# Patient Record
Sex: Female | Born: 1945 | Race: White | Hispanic: No | Marital: Married | Smoking: Never smoker
Health system: Southern US, Community
[De-identification: ages and names within clinical notes are randomized; demographics above are authoritative.]

## PROBLEM LIST (undated history)

## (undated) DIAGNOSIS — E785 Hyperlipidemia, unspecified: Secondary | ICD-10-CM

## (undated) DIAGNOSIS — I10 Essential (primary) hypertension: Secondary | ICD-10-CM

## (undated) DIAGNOSIS — E079 Disorder of thyroid, unspecified: Secondary | ICD-10-CM

## (undated) HISTORY — DX: Essential (primary) hypertension: I10

## (undated) HISTORY — DX: Disorder of thyroid, unspecified: E07.9

## (undated) HISTORY — PX: DILATION AND CURETTAGE OF UTERUS: SHX78

## (undated) HISTORY — DX: Hyperlipidemia, unspecified: E78.5

---
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
## 1981-02-24 DIAGNOSIS — E079 Disorder of thyroid, unspecified: Secondary | ICD-10-CM

## 1981-02-24 HISTORY — DX: Disorder of thyroid, unspecified: E07.9

## 1983-02-25 HISTORY — PX: ABDOMINAL HYSTERECTOMY: SHX81

## 1999-03-28 DIAGNOSIS — I10 Essential (primary) hypertension: Secondary | ICD-10-CM

## 1999-03-28 HISTORY — DX: Essential (primary) hypertension: I10

## 1999-07-26 DIAGNOSIS — E785 Hyperlipidemia, unspecified: Secondary | ICD-10-CM

## 1999-07-26 HISTORY — DX: Hyperlipidemia, unspecified: E78.5

## 1999-08-16 ENCOUNTER — Encounter: Payer: Self-pay | Admitting: Family Medicine

## 1999-08-16 LAB — CONVERTED CEMR LAB: TSH: 0.14 microintl units/mL

## 2001-06-03 ENCOUNTER — Encounter: Payer: Self-pay | Admitting: Family Medicine

## 2001-06-03 LAB — CONVERTED CEMR LAB: TSH: 4 microintl units/mL

## 2003-02-01 ENCOUNTER — Encounter: Payer: Self-pay | Admitting: Family Medicine

## 2003-02-01 LAB — CONVERTED CEMR LAB: TSH: 2.241 microintl units/mL

## 2003-02-02 ENCOUNTER — Encounter: Payer: Self-pay | Admitting: Family Medicine

## 2003-02-02 LAB — CONVERTED CEMR LAB: Blood Glucose, Fasting: 94 mg/dL

## 2004-01-06 ENCOUNTER — Emergency Department: Payer: Self-pay | Admitting: Emergency Medicine

## 2004-01-12 ENCOUNTER — Ambulatory Visit: Payer: Self-pay | Admitting: Family Medicine

## 2004-01-26 ENCOUNTER — Ambulatory Visit: Payer: Self-pay | Admitting: Family Medicine

## 2004-02-14 ENCOUNTER — Encounter: Admission: RE | Admit: 2004-02-14 | Discharge: 2004-02-14 | Payer: Self-pay | Admitting: Sports Medicine

## 2004-04-15 ENCOUNTER — Ambulatory Visit: Payer: Self-pay | Admitting: Family Medicine

## 2004-04-15 LAB — CONVERTED CEMR LAB: Blood Glucose, Fasting: 106 mg/dL

## 2004-04-17 ENCOUNTER — Ambulatory Visit: Payer: Self-pay | Admitting: Family Medicine

## 2004-04-17 ENCOUNTER — Other Ambulatory Visit: Admission: RE | Admit: 2004-04-17 | Discharge: 2004-04-17 | Payer: Self-pay | Admitting: Family Medicine

## 2004-04-17 LAB — CONVERTED CEMR LAB: Pap Smear: NORMAL

## 2004-05-14 ENCOUNTER — Ambulatory Visit: Payer: Self-pay | Admitting: Family Medicine

## 2004-07-19 ENCOUNTER — Ambulatory Visit: Payer: Self-pay | Admitting: Family Medicine

## 2004-07-24 ENCOUNTER — Ambulatory Visit: Payer: Self-pay | Admitting: Family Medicine

## 2005-12-12 ENCOUNTER — Ambulatory Visit: Payer: Self-pay | Admitting: Family Medicine

## 2005-12-15 ENCOUNTER — Ambulatory Visit: Payer: Self-pay | Admitting: Family Medicine

## 2005-12-16 ENCOUNTER — Ambulatory Visit: Payer: Self-pay | Admitting: Family Medicine

## 2006-04-20 ENCOUNTER — Ambulatory Visit: Payer: Self-pay | Admitting: Family Medicine

## 2006-04-20 LAB — CONVERTED CEMR LAB
ALT: 28 units/L (ref 0–40)
AST: 24 units/L (ref 0–37)
Albumin: 4.1 g/dL (ref 3.5–5.2)
Alkaline Phosphatase: 85 units/L (ref 39–117)
BUN: 14 mg/dL (ref 6–23)
Basophils Absolute: 0.1 10*3/uL (ref 0.0–0.1)
Basophils Relative: 0.6 % (ref 0.0–1.0)
Bilirubin, Direct: 0.1 mg/dL (ref 0.0–0.3)
Blood Glucose, Fasting: 109 mg/dL
CO2: 30 meq/L (ref 19–32)
Calcium: 9.5 mg/dL (ref 8.4–10.5)
Chloride: 103 meq/L (ref 96–112)
Cholesterol: 224 mg/dL (ref 0–200)
Creatinine, Ser: 0.7 mg/dL (ref 0.4–1.2)
Direct LDL: 128.4 mg/dL
Eosinophils Absolute: 0.2 10*3/uL (ref 0.0–0.6)
Eosinophils Relative: 1.9 % (ref 0.0–5.0)
Free T4: 0.8 ng/dL (ref 0.6–1.6)
GFR calc Af Amer: 109 mL/min
GFR calc non Af Amer: 90 mL/min
Glucose, Bld: 109 mg/dL — ABNORMAL HIGH (ref 70–99)
HCT: 41.3 % (ref 36.0–46.0)
HDL: 42.8 mg/dL (ref >39.0)
Hemoglobin: 14.1 g/dL (ref 12.0–15.0)
Lymphocytes Relative: 42 % (ref 12.0–46.0)
MCHC: 34.1 g/dL (ref 30.0–36.0)
MCV: 89.3 fL (ref 78.0–100.0)
Monocytes Absolute: 0.6 10*3/uL (ref 0.2–0.7)
Monocytes Relative: 6.9 % (ref 3.0–11.0)
Neutro Abs: 4 10*3/uL (ref 1.4–7.7)
Neutrophils Relative %: 48.6 % (ref 43.0–77.0)
Platelets: 292 10*3/uL (ref 150–400)
Potassium: 3.9 meq/L (ref 3.5–5.1)
RBC: 4.62 M/uL (ref 3.87–5.11)
RDW: 11.6 % (ref 11.5–14.6)
Sodium: 142 meq/L (ref 135–145)
TSH: 0.8 microintl units/mL (ref 0.35–5.50)
Total Bilirubin: 0.8 mg/dL (ref 0.3–1.2)
Total CHOL/HDL Ratio: 5.2
Total Protein: 7.5 g/dL (ref 6.0–8.3)
Triglycerides: 298 mg/dL (ref 0–149)
VLDL: 60 mg/dL — ABNORMAL HIGH (ref 0–40)
WBC, blood: 8.4 10*3/uL
WBC: 8.4 10*3/uL (ref 4.5–10.5)

## 2006-04-22 ENCOUNTER — Ambulatory Visit: Payer: Self-pay | Admitting: Family Medicine

## 2006-05-05 ENCOUNTER — Ambulatory Visit: Payer: Self-pay | Admitting: Family Medicine

## 2006-12-18 ENCOUNTER — Ambulatory Visit: Payer: Self-pay | Admitting: Family Medicine

## 2006-12-18 ENCOUNTER — Encounter: Payer: Self-pay | Admitting: Family Medicine

## 2006-12-21 ENCOUNTER — Encounter (INDEPENDENT_AMBULATORY_CARE_PROVIDER_SITE_OTHER): Payer: Self-pay | Admitting: *Deleted

## 2006-12-22 ENCOUNTER — Encounter: Payer: Self-pay | Admitting: Family Medicine

## 2006-12-22 DIAGNOSIS — E039 Hypothyroidism, unspecified: Secondary | ICD-10-CM | POA: Insufficient documentation

## 2006-12-22 DIAGNOSIS — R7309 Other abnormal glucose: Secondary | ICD-10-CM

## 2006-12-22 DIAGNOSIS — E785 Hyperlipidemia, unspecified: Secondary | ICD-10-CM | POA: Insufficient documentation

## 2006-12-22 DIAGNOSIS — I10 Essential (primary) hypertension: Secondary | ICD-10-CM | POA: Insufficient documentation

## 2007-06-11 ENCOUNTER — Ambulatory Visit: Payer: Self-pay | Admitting: Family Medicine

## 2007-08-03 ENCOUNTER — Ambulatory Visit: Payer: Self-pay | Admitting: Family Medicine

## 2007-08-03 LAB — CONVERTED CEMR LAB
Albumin: 4.2 g/dL (ref 3.5–5.2)
Alkaline Phosphatase: 84 units/L (ref 39–117)
Basophils Absolute: 0 10*3/uL (ref 0.0–0.1)
Bilirubin, Direct: 0.1 mg/dL (ref 0.0–0.3)
Chloride: 101 meq/L (ref 96–112)
Cholesterol: 214 mg/dL (ref 0–200)
Direct LDL: 134.1 mg/dL
Eosinophils Absolute: 0.2 10*3/uL (ref 0.0–0.7)
Free T4: 1 ng/dL (ref 0.6–1.6)
GFR calc Af Amer: 93 mL/min
GFR calc non Af Amer: 77 mL/min
HCT: 42.3 % (ref 36.0–46.0)
HDL: 39.5 mg/dL (ref >39.0)
MCV: 92.7 fL (ref 78.0–100.0)
Microalb Creat Ratio: 2.7 mg/g (ref 0.0–30.0)
Monocytes Absolute: 0.5 10*3/uL (ref 0.1–1.0)
Neutrophils Relative %: 53.6 % (ref 43.0–77.0)
Platelets: 253 10*3/uL (ref 150–400)
Potassium: 4 meq/L (ref 3.5–5.1)
RDW: 11.8 % (ref 11.5–14.6)
Sodium: 140 meq/L (ref 135–145)
TSH: 0.74 microintl units/mL (ref 0.35–5.50)
Total Bilirubin: 0.7 mg/dL (ref 0.3–1.2)
Triglycerides: 258 mg/dL (ref 0–149)
Uric Acid, Serum: 8.6 mg/dL — ABNORMAL HIGH (ref 2.4–7.0)

## 2007-08-05 ENCOUNTER — Encounter (INDEPENDENT_AMBULATORY_CARE_PROVIDER_SITE_OTHER): Payer: Self-pay | Admitting: *Deleted

## 2007-08-06 ENCOUNTER — Ambulatory Visit: Payer: Self-pay | Admitting: Family Medicine

## 2007-08-06 ENCOUNTER — Encounter: Payer: Self-pay | Admitting: Family Medicine

## 2007-08-06 ENCOUNTER — Other Ambulatory Visit: Admission: RE | Admit: 2007-08-06 | Discharge: 2007-08-06 | Payer: Self-pay | Admitting: Family Medicine

## 2007-08-06 LAB — CONVERTED CEMR LAB: Pap Smear: NORMAL

## 2007-08-11 ENCOUNTER — Encounter: Payer: Self-pay | Admitting: Family Medicine

## 2007-08-11 ENCOUNTER — Ambulatory Visit: Payer: Self-pay | Admitting: Family Medicine

## 2007-08-12 ENCOUNTER — Encounter (INDEPENDENT_AMBULATORY_CARE_PROVIDER_SITE_OTHER): Payer: Self-pay | Admitting: *Deleted

## 2007-09-27 ENCOUNTER — Ambulatory Visit: Payer: Self-pay | Admitting: Family Medicine

## 2007-10-04 LAB — CONVERTED CEMR LAB: OCCULT 3: NEGATIVE

## 2007-10-20 ENCOUNTER — Ambulatory Visit: Payer: Self-pay | Admitting: Family Medicine

## 2007-10-25 ENCOUNTER — Ambulatory Visit: Payer: Self-pay | Admitting: Family Medicine

## 2007-11-03 ENCOUNTER — Encounter (INDEPENDENT_AMBULATORY_CARE_PROVIDER_SITE_OTHER): Payer: Self-pay | Admitting: *Deleted

## 2007-12-13 ENCOUNTER — Encounter: Payer: Self-pay | Admitting: Family Medicine

## 2007-12-20 ENCOUNTER — Ambulatory Visit: Payer: Self-pay | Admitting: Family Medicine

## 2007-12-23 ENCOUNTER — Ambulatory Visit: Payer: Self-pay | Admitting: Family Medicine

## 2007-12-23 ENCOUNTER — Encounter: Payer: Self-pay | Admitting: Family Medicine

## 2007-12-24 ENCOUNTER — Telehealth (INDEPENDENT_AMBULATORY_CARE_PROVIDER_SITE_OTHER): Payer: Self-pay | Admitting: Internal Medicine

## 2007-12-27 ENCOUNTER — Encounter (INDEPENDENT_AMBULATORY_CARE_PROVIDER_SITE_OTHER): Payer: Self-pay | Admitting: *Deleted

## 2008-01-24 ENCOUNTER — Ambulatory Visit: Payer: Self-pay | Admitting: Family Medicine

## 2008-02-25 HISTORY — PX: ULNAR NERVE REPAIR: SHX2594

## 2008-03-14 ENCOUNTER — Ambulatory Visit: Payer: Self-pay | Admitting: Family Medicine

## 2008-03-16 ENCOUNTER — Ambulatory Visit: Payer: Self-pay | Admitting: Internal Medicine

## 2008-03-16 DIAGNOSIS — K625 Hemorrhage of anus and rectum: Secondary | ICD-10-CM

## 2008-03-16 DIAGNOSIS — K644 Residual hemorrhoidal skin tags: Secondary | ICD-10-CM | POA: Insufficient documentation

## 2008-08-01 ENCOUNTER — Ambulatory Visit: Payer: Self-pay | Admitting: Family Medicine

## 2008-08-01 LAB — CONVERTED CEMR LAB
Albumin: 4.2 g/dL (ref 3.5–5.2)
Basophils Relative: 0.5 % (ref 0.0–3.0)
Bilirubin, Direct: 0.1 mg/dL (ref 0.0–0.3)
CO2: 30 meq/L (ref 19–32)
Calcium: 9.4 mg/dL (ref 8.4–10.5)
Cholesterol: 210 mg/dL — ABNORMAL HIGH (ref 0–200)
Creatinine, Ser: 0.7 mg/dL (ref 0.4–1.2)
Creatinine,U: 155.1 mg/dL
Direct LDL: 135.9 mg/dL
HDL: 41.7 mg/dL (ref >39.00)
Hemoglobin: 13.9 g/dL (ref 12.0–15.0)
Lymphocytes Relative: 33.4 % (ref 12.0–46.0)
MCHC: 34.8 g/dL (ref 30.0–36.0)
Microalb, Ur: 0.4 mg/dL (ref 0.0–1.9)
Monocytes Relative: 5.2 % (ref 3.0–12.0)
Neutro Abs: 6 10*3/uL (ref 1.4–7.7)
RBC: 4.35 M/uL (ref 3.87–5.11)
Total CHOL/HDL Ratio: 5
Total Protein: 7.4 g/dL (ref 6.0–8.3)
Triglycerides: 253 mg/dL — ABNORMAL HIGH (ref 0.0–149.0)
Uric Acid, Serum: 8.2 mg/dL — ABNORMAL HIGH (ref 2.4–7.0)

## 2008-08-07 ENCOUNTER — Ambulatory Visit: Payer: Self-pay | Admitting: Family Medicine

## 2008-08-07 DIAGNOSIS — M25519 Pain in unspecified shoulder: Secondary | ICD-10-CM | POA: Insufficient documentation

## 2008-08-09 ENCOUNTER — Encounter: Payer: Self-pay | Admitting: Family Medicine

## 2008-08-25 ENCOUNTER — Ambulatory Visit: Payer: Self-pay | Admitting: Family Medicine

## 2008-08-25 ENCOUNTER — Encounter (INDEPENDENT_AMBULATORY_CARE_PROVIDER_SITE_OTHER): Payer: Self-pay | Admitting: *Deleted

## 2008-10-31 ENCOUNTER — Ambulatory Visit: Payer: Self-pay | Admitting: Family Medicine

## 2008-11-06 ENCOUNTER — Encounter: Payer: Self-pay | Admitting: Family Medicine

## 2008-12-20 ENCOUNTER — Ambulatory Visit: Payer: Self-pay | Admitting: Family Medicine

## 2008-12-20 ENCOUNTER — Encounter: Payer: Self-pay | Admitting: Family Medicine

## 2008-12-21 ENCOUNTER — Encounter (INDEPENDENT_AMBULATORY_CARE_PROVIDER_SITE_OTHER): Payer: Self-pay | Admitting: *Deleted

## 2009-01-31 ENCOUNTER — Ambulatory Visit: Payer: Self-pay | Admitting: Family Medicine

## 2009-03-08 ENCOUNTER — Ambulatory Visit: Payer: Self-pay | Admitting: Family Medicine

## 2009-03-14 LAB — CONVERTED CEMR LAB
BUN: 12 mg/dL (ref 6–23)
CO2: 29 meq/L (ref 19–32)
Chloride: 101 meq/L (ref 96–112)
Creatinine, Ser: 0.8 mg/dL (ref 0.4–1.2)

## 2009-05-02 ENCOUNTER — Ambulatory Visit: Payer: Self-pay | Admitting: Family Medicine

## 2009-05-02 LAB — CONVERTED CEMR LAB
BUN: 10 mg/dL (ref 6–23)
Calcium: 10 mg/dL (ref 8.4–10.5)
GFR calc non Af Amer: 89.15 mL/min (ref >60)
Glucose, Bld: 117 mg/dL — ABNORMAL HIGH (ref 70–99)
Potassium: 3.9 meq/L (ref 3.5–5.1)

## 2009-05-08 ENCOUNTER — Ambulatory Visit: Payer: Self-pay | Admitting: Family Medicine

## 2009-07-09 IMAGING — US ULTRASOUND RIGHT BREAST
1 series · 9 of 9 positions shown · non-contrast
Comparison: none

REASON FOR EXAM: nodularity density
COMMENTS:

PROCEDURE:     US  - US BREAST RIGHT  - December 23, 2007 [DATE]
RESULT:
HISTORY: 52-year-old female presents for further evaluation of a nodular
density in the upper outer RIGHT breast.

[Series 1: ultrasound right breast · 9 of 9 slices shown]
[im 1/9]
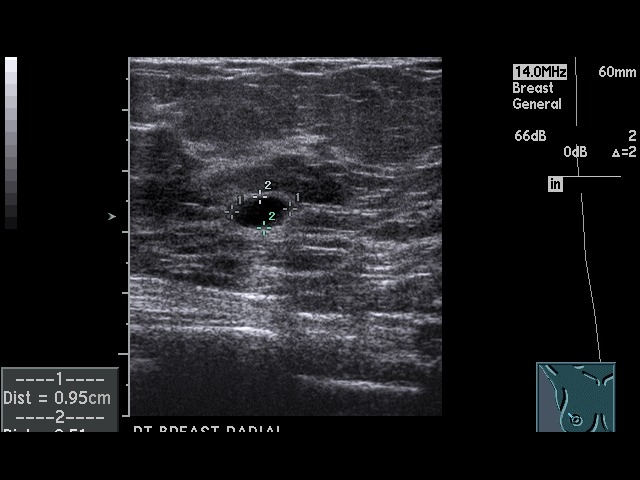
[im 2/9]
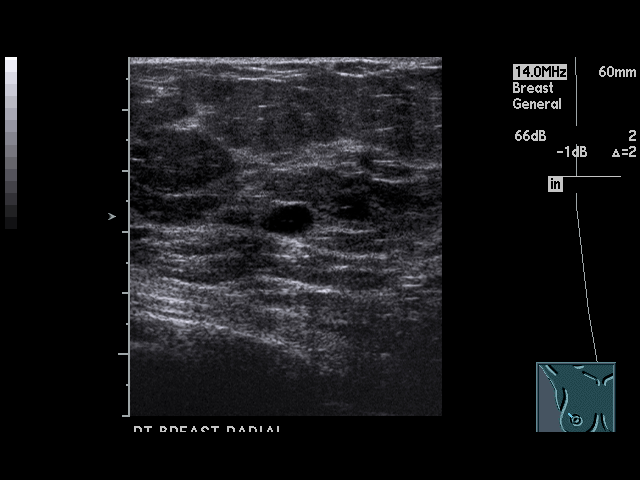
[im 3/9]
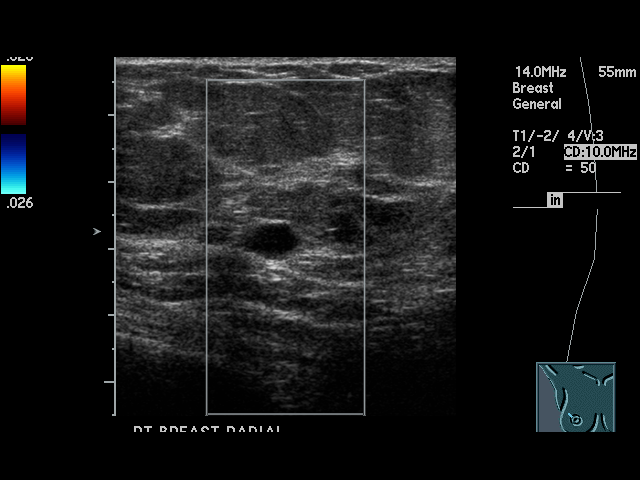
[im 4/9]
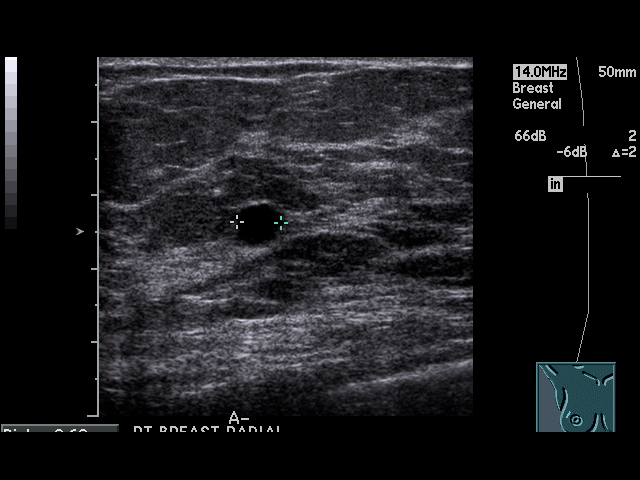
[im 5/9]
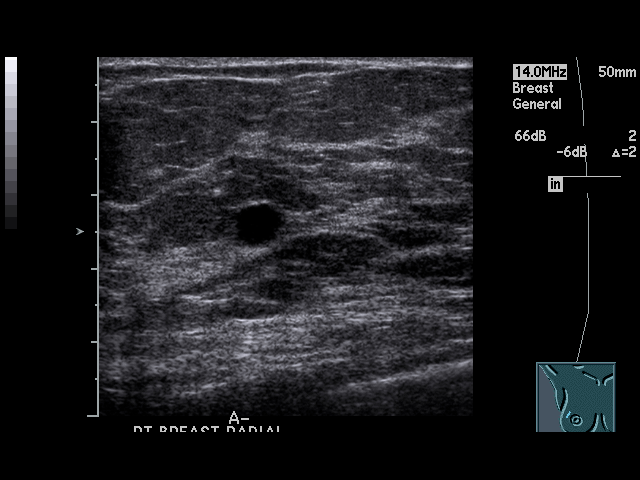
[im 6/9]
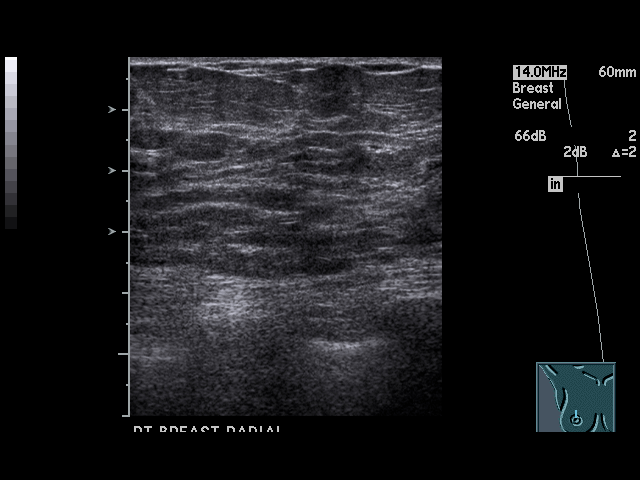
[im 7/9]
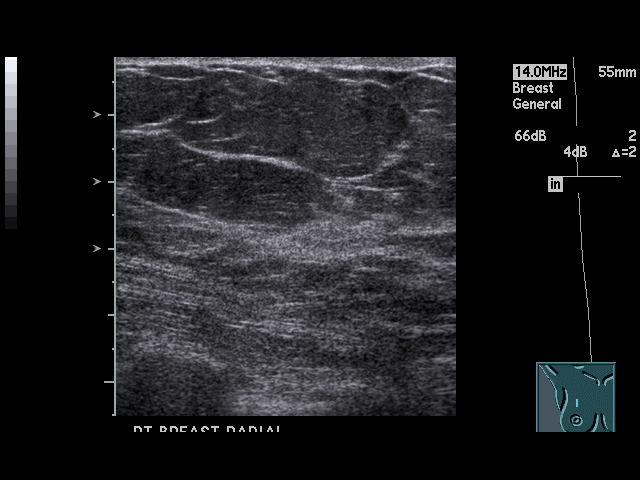
[im 8/9]
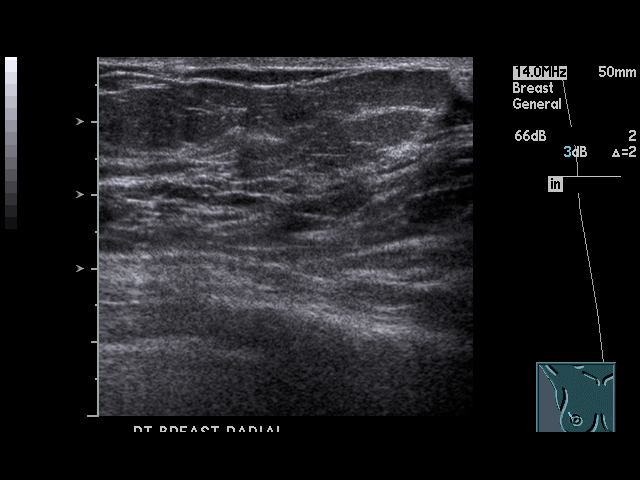
[im 9/9]
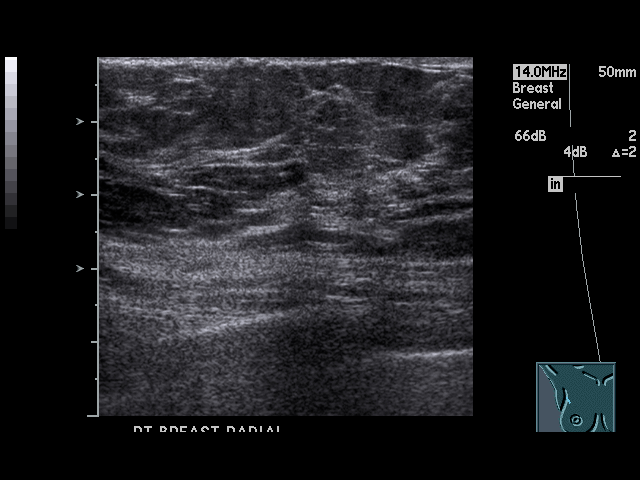

[9 of 9 positions shown; findings below may reference images not displayed]

FINDINGS: The nodular density is again demonstrated in the upper outer
RIGHT breast. Further evaluation was performed with true lateral view of the
RIGHT breast and spot compression and magnification views of the RIGHT upper
outer breast in the CC and MLO projections demonstrating a fairly well
circumscribed nodular density in the RIGHT upper outer breast at
approximately the 11 o'clock position.

Further evaluation was performed with real-time ultrasound. Real-time
sonography of the upper outer breast demonstrated a 0.9 x 5 x 6 mm anechoic
structure with increased through transmission and an imperceptible posterior
wall.  The appearance is most consistent with a cyst. There was no mural
nodule.  There is no architectural distortion. There are no other cystic or
solid masses. There is no internal Doppler flow.
IMPRESSION: 1.     Nodular density in the RIGHT upper outer breast with sonographic
characteristics of a simple cyst. No further evaluation is recommended.
2.     Return to annual mammographic follow up is recommended.
3.     BI-RADS:  Category 2-Benign Finding.

## 2009-09-26 ENCOUNTER — Encounter (INDEPENDENT_AMBULATORY_CARE_PROVIDER_SITE_OTHER): Payer: Self-pay | Admitting: *Deleted

## 2009-10-05 ENCOUNTER — Encounter: Payer: Self-pay | Admitting: Family Medicine

## 2009-10-05 ENCOUNTER — Ambulatory Visit: Payer: Self-pay | Admitting: Family Medicine

## 2009-10-15 ENCOUNTER — Ambulatory Visit: Payer: Self-pay | Admitting: Family Medicine

## 2009-10-16 ENCOUNTER — Encounter (INDEPENDENT_AMBULATORY_CARE_PROVIDER_SITE_OTHER): Payer: Self-pay | Admitting: *Deleted

## 2009-12-04 ENCOUNTER — Ambulatory Visit: Payer: Self-pay | Admitting: Family Medicine

## 2009-12-04 DIAGNOSIS — M109 Gout, unspecified: Secondary | ICD-10-CM | POA: Insufficient documentation

## 2009-12-05 LAB — CONVERTED CEMR LAB: Vit D, 25-Hydroxy: 59 ng/mL (ref 30–89)

## 2009-12-07 ENCOUNTER — Ambulatory Visit: Payer: Self-pay | Admitting: Family Medicine

## 2009-12-09 LAB — CONVERTED CEMR LAB
ALT: 40 units/L — ABNORMAL HIGH (ref 0–35)
Albumin: 4.2 g/dL (ref 3.5–5.2)
BUN: 15 mg/dL (ref 6–23)
Chloride: 101 meq/L (ref 96–112)
Creatinine, Ser: 0.8 mg/dL (ref 0.4–1.2)
Glucose, Bld: 132 mg/dL — ABNORMAL HIGH (ref 70–99)
HDL: 41.6 mg/dL (ref >39.00)
TSH: 0.81 microintl units/mL (ref 0.35–5.50)
Total Bilirubin: 0.7 mg/dL (ref 0.3–1.2)
Uric Acid, Serum: 10.3 mg/dL — ABNORMAL HIGH (ref 2.4–7.0)

## 2009-12-24 LAB — HM MAMMOGRAPHY: HM Mammogram: NORMAL

## 2009-12-25 ENCOUNTER — Encounter: Payer: Self-pay | Admitting: Family Medicine

## 2009-12-25 ENCOUNTER — Ambulatory Visit: Payer: Self-pay | Admitting: Family Medicine

## 2009-12-26 ENCOUNTER — Ambulatory Visit: Payer: Self-pay | Admitting: Family Medicine

## 2009-12-26 ENCOUNTER — Encounter: Payer: Self-pay | Admitting: Family Medicine

## 2010-03-11 ENCOUNTER — Ambulatory Visit
Admission: RE | Admit: 2010-03-11 | Discharge: 2010-03-11 | Payer: Self-pay | Source: Home / Self Care | Attending: Family Medicine | Admitting: Family Medicine

## 2010-03-11 ENCOUNTER — Other Ambulatory Visit: Payer: Self-pay | Admitting: Family Medicine

## 2010-03-11 LAB — HEMOGLOBIN A1C: Hgb A1c MFr Bld: 6.1 % (ref 4.6–6.5)

## 2010-03-11 LAB — LDL CHOLESTEROL, DIRECT: Direct LDL: 144.1 mg/dL

## 2010-03-11 LAB — LIPID PANEL
Cholesterol: 211 mg/dL — ABNORMAL HIGH (ref 0–200)
HDL: 42.2 mg/dL (ref >39.00)
Total CHOL/HDL Ratio: 5
Triglycerides: 232 mg/dL — ABNORMAL HIGH (ref 0.0–149.0)
VLDL: 46.4 mg/dL — ABNORMAL HIGH (ref 0.0–40.0)

## 2010-03-14 ENCOUNTER — Ambulatory Visit
Admission: RE | Admit: 2010-03-14 | Discharge: 2010-03-14 | Payer: Self-pay | Source: Home / Self Care | Attending: Family Medicine | Admitting: Family Medicine

## 2010-03-26 NOTE — Letter (Signed)
Summary: [facility]: Immunoassay Fecal Occult Blood (iFOB) Order Form  [place] at [facility]  [address] [place], [place] [address]   Phone: [phone]  Fax: [phone]      [facility]: Immunoassay Fecal Occult Blood (iFOB) Order Form   October 05, 2009 MRN: [id number]   Jessica Lamb Sep 05, 1945   Physicican Name:______duncan___________________  Diagnosis Code:_______v76.49___________________      Crawford Givens MD

## 2010-03-26 NOTE — Letter (Signed)
Summary: Jessica Lamb letter  [place] at [facility]  [address] [place], [place] [address]   Phone: [phone]  Fax: [phone]       09/26/2009 MRN: [id number]  KATHERN LOBOSCO [address] [place], [place]  [address]  Dear Ms. [facility] [place], and [place] announce the retirement of Arta Silence, M.D., from full-time practice at the [facility] office effective August 23, 2009 and his plans of returning part-time.  It is important to Dr. Hetty Ely and to our practice that you understand that [facility] has seven physicians in our office for your health care needs.  We will continue to offer the same exceptional care that you have today.    Dr. Hetty Ely has spoken to many of you about his plans for retirement and returning part-time in the fall.   We will continue to work with you through the transition to schedule appointments for you in the office and meet the high standards that [facility] is committed to.   Again, it is with great pleasure that we share the news that Dr. Hetty Ely will return to [facility] in October of 2011 with a reduced schedule.    If you have any questions, or would like to request an appointment with one of our physicians, please call us at [phone] and press the option for Scheduling an appointment.  We take pleasure in providing you with excellent patient care and look forward to seeing you at your next office visit.  Our [facility] Physicians are:  Tillman Abide, M.D. Laurita Quint, M.D. Roxy Manns, M.D. Kerby Nora, M.D. Hannah Beat, M.D. Ruthe Mannan, M.D. We proudly welcomed Raechel Ache, M.D. and Eustaquio Boyden, M.D. to the practice in July/August 2011.  Sincerely,  [facility]

## 2010-03-26 NOTE — Assessment & Plan Note (Signed)
Summary: Jessica Lamb FROM [facility]/DLO   Vital Signs:  Patient profile:   65 year old female Height:      63 inches Weight:      178.75 pounds BMI:     31.78 Temp:     98.7 degrees F oral Pulse rate:   84 / minute Pulse rhythm:   regular BP sitting:   126 / 80  (left arm) Cuff size:   regular  Vitals Entered By: Delilah Shan CMA Alistar Mcenery Dull) (October 05, 2009 11:33 AM) CC: Transfer from [facility]   History of Present Illness: Hypertension:      Using medication without problems or lightheadedness: yes Chest pain with exertion:no Edema:no Short of breath:no Average home BPs: lower than today when check at home Other issues:   Hypothroid- No symptoms of over/under replacement.  No neck mass/pain/dysphagia.  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  Prev scan was 2009.  No heartburn or adverse effect from med.  Due for repeat DXA.   Rare gout flare, controlled with meds.   Allergies: No Known Drug Allergies  Past History:  Family History: Last updated: 08/07/2008 Father: dec 73 yoa CHF Mother: dec 86 YOA HEART FAILURE-- NATURAL CAUSES BROTHER  A 80  PACER SISTER DECEASED 79 YOA LUNG CANCER SISTER dec 33  OVARIAN CANCER SISTER dec 79 YOA ALZHEIMERS SISTER  A 66  CANCER OF KIDNEY AND BLADDER//NEPHRECTOMY SISTER  A 87  DM, AND ALZHEIMERS STROKE CV: + FATHER CHF, ? 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 Father: dec 73 yoa CHF Mother: dec 86 YOA HEART FAILURE-- NATURAL CAUSES BROTHER  A 80  PACER SISTER DECEASED 79 YOA LUNG CANCER SISTER dec 33  OVARIAN CANCER SISTER dec 79 YOA ALZHEIMERS SISTER  A 66  CANCER OF KIDNEY AND BLADDER//NEPHRECTOMY SISTER  A 87  DM, AND ALZHEIMERS STROKE CV: + FATHER CHF, ? MOTHER CHF HBP: NEGATIVE DM: + MOTHER AND 1 SISTER GOUT ARTHRITIS:  PROSTATE CANCER: 1 SISTER LUNG BREAST/OVARIAN/UTERINE CANCER: + SISTER OVARIAN COLON CANCER:  DEPRESSION: NEGATIVE ETOH/DRUG ABUSE: NEGATIVE OTHER: STROKE NEGATIVE  Social History: Reviewed history from 08/06/2007 and no changes required. 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  Otherwise negative.    Physical Exam  General:  GEN: nad, alert and oriented HEENT: mucous membranes moist NECK: supple w/o LA CV: rrr.  no murmur PULM: ctab, no inc wob ABD: soft, +bs EXT: no edema SKIN: no acute rash    Impression & Recommendations:  Problem # 1:  OSTEOPOROSIS (ICD-733.00) Send for  DXA and finish up fosamas at the end of this year.  Her updated medication list for this problem includes:    Fosamax 70 Mg Tabs (Alendronate sodium) .Marland Kitchen... Take 1 tablet by mouth once a week    Vitamin D 1000 Unit Tabs (Cholecalciferol) ..... One tab by mouth two times a day  Orders: Prescription Created Electronically [id number]) Radiology Referral (Radiology)  Problem # 2:  HYPOTHYROIDISM (ICD-244.9) Will check TSH with other labs.  Her  updated medication list for this problem includes:    Levoxyl 112 Mcg Tabs (Levothyroxine sodium) .Marland Kitchen... 1 tablet by mouth once a day  Orders: Prescription Created Electronically [phone])  Problem # 3:  HYPERTENSION (ICD-401.9) No chang ein meds.  Return for labs.  The following medications were removed from the medication list:    Hydrochlorothiazide 12.5 Mg Caps (Hydrochlorothiazide) .Marland Kitchen... 1 daily by mouth    Lisinopril-hydrochlorothiazide 10-12.5 Mg Tabs (Lisinopril-hydrochlorothiazide) ..... One tab by mouth in am. Her updated medication list for this problem includes:    Atenolol 25 Mg Tabs (Atenolol) .Marland Kitchen... 1 tablet by mouth at bedtime    Prinivil 10 Mg Tabs (Lisinopril) .Marland Kitchen... 1 by mouth q day    Hydrochlorothiazide 25 Mg Tabs (Hydrochlorothiazide) .Marland Kitchen... 1 by mouth qday  Orders: Prescription Created Electronically [phone])  Problem # 4:  GOUTY ARTHRITIS (ICD-274.0) Check uric acid and follow up as needed.  controlled.  Orders: Prescription Created Electronically [phone])  Her updated medication list for this problem includes:    Indomethacin 50 Mg Caps (Indomethacin) .Marland Kitchen... Take with food. 1 by mouth qid x1d, 1 by mouth three times a day x1d, then 1 two times a day x1d, then 1 by mouth once daily for gout flare.  Complete Medication List: 1)  Atenolol 25 Mg Tabs (Atenolol) .Marland Kitchen.. 1 tablet by mouth at bedtime 2)  Fosamax 70 Mg Tabs (Alendronate sodium) .... Take 1 tablet by mouth once a week 3)  Levoxyl 112 Mcg Tabs (Levothyroxine sodium) .Marland Kitchen.. 1 tablet by mouth once a day 4)  Fish Oil Concentrate 1000 Mg Caps (Omega-3 fatty acids) .Marland Kitchen.. 1 daily by mouth 5)  Multivitamins Tabs (Multiple vitamin) .Marland Kitchen.. 1 daily by mouth 6)  Bayer Aspirin Ec Low Dose 81 Mg Tbec (Aspirin) .Marland Kitchen.. 1 daily by mouth 7)  Vitamin D 1000 Unit Tabs (Cholecalciferol) .... One tab by mouth two times a day 8)  Prinivil 10 Mg Tabs (Lisinopril) .Marland Kitchen.. 1 by mouth q day 9)  Hydrochlorothiazide 25 Mg Tabs (Hydrochlorothiazide)  .Marland Kitchen.. 1 by mouth qday 10)  Indomethacin 50 Mg Caps (Indomethacin) .... Take with food. 1 by mouth qid x1d, 1 by mouth three times a day x1d, then 1 two times a day x1d, then 1 by mouth once daily for gout flare.  Patient Instructions: 1)  Return for fasting labs and then a physical in October (after your mammogram and bone scan).  You can drop off the stool sample when you come back.  2)  cmet 401.1 3)  lipid 401.1 4)  uric acid 274.0 5)  TSH 244.9 6)  vitamin D 733.0 7)  See Shirlee Limerick about your referral before your leave today.   Prescriptions: INDOMETHACIN 50 MG CAPS (INDOMETHACIN) take with food. 1 by mouth qid x1d, 1 by mouth three times a day x1d, then 1 two times a day x1d, then 1 by mouth once daily for gout flare.  #30 x 3   Entered and Authorized by:   Crawford Givens MD   Signed by:   Cheree Ditto  Para March MD on 10/05/2009   Method used:   Electronically to        [facility] [address] (retail)       [address], [address]       [place], [place]  [address]       Ph: [phone]       Fax: [phone]   RxID:   [id number] HYDROCHLOROTHIAZIDE 25 MG TABS (HYDROCHLOROTHIAZIDE) 1 by mouth qday  #90 x 3   Entered and Authorized by:   Crawford Givens MD   Signed by:   Crawford Givens MD on 10/05/2009   Method used:   Electronically to        [facility] [address] (retail)       [address], [address]       [place], [place]  [address]       Ph: [phone]       Fax: [phone]   RxID:   [id number] PRINIVIL 10 MG TABS (LISINOPRIL) 1 by mouth q day  #90 x 3   Entered and Authorized by:   Crawford Givens MD   Signed by:   Crawford Givens MD on 10/05/2009   Method used:   Electronically to        [facility] [address] (retail)       [address], [address]       [place], [place]  [address]       Ph: [phone]       Fax: [phone]   RxID:   [id number] LEVOXYL 112 MCG TABS  (LEVOTHYROXINE SODIUM) 1 tablet by mouth once a day  #90 x 3   Entered and Authorized by:   Crawford Givens MD   Signed by:   Crawford Givens MD on 10/05/2009   Method used:   Electronically to        [facility] [address] (retail)       [address], [address]       [place], [place]  [address]       Ph: [phone]       Fax: [phone]   RxID:   [id number] FOSAMAX 70 MG TABS (ALENDRONATE SODIUM) Take 1 tablet by mouth once a week  #12 x 0   Entered and Authorized by:   Crawford Givens MD   Signed by:   Crawford Givens MD on 10/05/2009   Method used:   Electronically to        [facility] [address] (retail)       [address], [address]       [place], [place]  [address]       Ph: [phone]       Fax: [phone]   RxID:   [id number] ATENOLOL 25 MG  TABS (ATENOLOL) 1 tablet by mouth at bedtime  #90 x 3   Entered and Authorized by:   Crawford Givens MD   Signed by:   Crawford Givens MD on 10/05/2009   Method used:   Electronically to        [facility] [address] (retail)       [address]       [place], [place]  [address]       Ph: [phone]       Fax: [phone]   RxID:   [id number]   Current Allergies (reviewed today): No known allergies

## 2010-03-26 NOTE — Assessment & Plan Note (Signed)
Summary: ROA IN OCT CYD   Vital Signs:  Patient profile:   65 year old female Height:      63 inches Weight:      179.75 pounds BMI:     31.96 Temp:     98.6 degrees F oral Pulse rate:   76 / minute Pulse rhythm:   regular BP sitting:   124 / 76  (left arm) Cuff size:   regular  Vitals Entered By: Lewanda Rife LPN (December 07, 2009 8:32 AM) CC: follow-up visit   History of Present Illness: gout-labs reviewed with aptient.  occ flare with pain.  See plan.   sugar- labs d/w patient.  elevated glucose, h/o.  Needs to work on diet and exercise.  See plan.   hypothyroidism- labs d/w patient.  tsh wnl.  no mass in neck,  no dysphagia. no symptoms of over/underreplacement.   osteoporosis- on fosamax for 5 years as of 12/11.  due for DXA, scheduled.   HLD.  labs reviewed with patient. not on meds currently.  will work on diet/weight/exercise.  see plan.   Allergies (verified): No Known Drug Allergies  Past History:  Past Medical History: Last updated: 10/05/2009 Hyperlipidemia: 07/1999 Hypertension: 03/1999 Hypothyroidism: 1983 Osteoporosis: 02/2004 Gout  Past Surgical History: Last updated: 10/05/2009 Tonsillectomy 1957 NSVD x 3 D&C x 2 ) Hysterectomy w/ BSO Fibroid 1985 Dexa scan -- osteoporosis:(02/2004) Ulnar nerve release L elbow 2010  Family History: Reviewed history from 08/07/2008 and no changes required. Father: dec 73 yoa CHF Mother: dec 86 YOA HEART FAILURE-- NATURAL CAUSES BROTHER  A 80  PACER SISTER DECEASED 79 YOA LUNG CANCER SISTER dec 33  OVARIAN CANCER SISTER dec 79 YOA ALZHEIMERS SISTER  A 66  CANCER OF KIDNEY AND BLADDER//NEPHRECTOMY SISTER  A 87  DM, AND ALZHEIMERS STROKE CV: + FATHER CHF, ? MOTHER CHF HBP: NEGATIVE DM: + MOTHER AND 1 SISTER GOUT ARTHRITIS:  PROSTATE CANCER: 1 SISTER LUNG BREAST/OVARIAN/UTERINE CANCER: + SISTER OVARIAN COLON CANCER:  DEPRESSION: NEGATIVE ETOH/DRUG ABUSE: NEGATIVE OTHER: STROKE  NEGATIVE  Social History: Reviewed history from 10/05/2009 and no changes required. 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  Otherwise negative.    Physical Exam  General:  GEN: nad, alert and oriented HEENT: mucous membranes moist NECK: supple w/o LA CV: rrr.  no murmur PULM: ctab, no inc wob ABD: soft, +bs EXT: no edema SKIN: no acute rash    Impression & Recommendations:  Problem # 1:  GOUTY ARTHROPATHY UNSPECIFIED (ICD-274.00) I d/w patient ZO:XWRU and weight.  If she continues to have symptoms, then we need to change her meds.  In meantime, work on weight.  Her updated medication list for this problem includes:    Indomethacin 50 Mg Caps (Indomethacin) .Marland Kitchen... Take with food. 1 by mouth qid x1d, 1 by mouth three times a day x1d, then 1 two times a day x1d, then 1 by mouth once daily for gout flare.  Problem # 2:  HYPERGLYCEMIA (ICD-790.29) Return for follow up labs.  diet d/w patient and handout given.   Problem # 3:  OSTEOPOROSIS (ICD-733.00) finish 5 years of meds 12/11.  will contact patient with DXA.  Her updated medication list for this problem includes:    Fosamax  70 Mg Tabs (Alendronate sodium) .Marland Kitchen... Take 1 tablet by mouth once a week    Vitamin D 1000 Unit Tabs (Cholecalciferol) ..... One tab by mouth two times a day  Problem # 4:  HYPOTHYROIDISM (ICD-244.9) no change in med.s  Her updated medication list for this problem includes:    Levoxyl 112 Mcg Tabs (Levothyroxine sodium) .Marland Kitchen... 1 tablet by mouth once a day  Problem # 5:  HYPERLIPIDEMIA (ICD-272.4) d/w patient.  See plan.   Complete Medication List: 1)  Atenolol 25 Mg Tabs (Atenolol) .Marland Kitchen.. 1 tablet by mouth at bedtime 2)  Fosamax 70 Mg Tabs (Alendronate sodium) .... Take 1 tablet by  mouth once a week 3)  Levoxyl 112 Mcg Tabs (Levothyroxine sodium) .Marland Kitchen.. 1 tablet by mouth once a day 4)  Fish Oil Concentrate 1000 Mg Caps (Omega-3 fatty acids) .Marland Kitchen.. 1 daily by mouth 5)  Multivitamins Tabs (Multiple vitamin) .Marland Kitchen.. 1 daily by mouth 6)  Bayer Aspirin Ec Low Dose 81 Mg Tbec (Aspirin) .Marland Kitchen.. 1 daily by mouth 7)  Vitamin D 1000 Unit Tabs (Cholecalciferol) .... One tab by mouth two times a day 8)  Prinivil 10 Mg Tabs (Lisinopril) .Marland Kitchen.. 1 by mouth every day 9)  Hydrochlorothiazide 25 Mg Tabs (Hydrochlorothiazide) .Marland Kitchen.. 1 by mouth every day 10)  Indomethacin 50 Mg Caps (Indomethacin) .... Take with food. 1 by mouth qid x1d, 1 by mouth three times a day x1d, then 1 two times a day x1d, then 1 by mouth once daily for gout flare.  Patient Instructions: 1)  I want you to come back for repeat lipids 272.4 and A1c 790.29 in 3 months.  Come in fasting for labs.  2)  Come in for a apopintment a few days after that. 3)  Work on [employer] and exercise in the meantime.  Look at the ADA and [facility] website and use the 'eat right' diet handout.  4)  Take care.  I was glad to see you today. 5)  Think about getting a flu, pneumonia and shingles shot.   Current Allergies (reviewed today): No known allergies

## 2010-03-26 NOTE — Assessment & Plan Note (Signed)
Summary: 3 MONTH FOLLOW UP/[facility]   Vital Signs:  Patient profile:   65 year old female Weight:      179 pounds BMI:     31.82 Temp:     98.6 degrees F oral Pulse rate:   60 / minute Pulse rhythm:   regular BP sitting:   120 / 60  (left arm) Cuff size:   large  Vitals Entered By: Sydell Axon LPN (May 08, 2009 9:10 AM) CC: 3 Month follow-up after labs   History of Present Illness: Pt jhere fort 3 month f/u of BP, put on Linsinopril with her HCTZ last time. Her pressures at home generally agree with her BP here this AM. She feels well and has had no problem with the medication nor has any new problems today.  Problems Prior to Update: 1)  Special Screening Malig Neoplasms Other Sites  (ICD-V76.49) 2)  Shoulder Pain, Right  (ICD-719.41) 3)  Lesion of Left Ulnar Nerve  (ICD-354.2) 4)  External Hemorrhoids  (ICD-455.3) 5)  Rectal Bleeding  (ICD-569.3) 6)  Health Maintenance Exam  (ICD-V70.0) 7)  Other Screening Mammogram  (ICD-V76.12) 8)  Gouty Arthritis  (ICD-274.0) 9)  Hyperglycemia  (ICD-790.29) 10)  Osteoporosis  (ICD-733.00) 11)  Hypothyroidism  (ICD-244.9) 12)  Hypertension  (ICD-401.9) 13)  Hyperlipidemia  (ICD-272.4)  Medications Prior to Update: 1)  Atenolol 25 Mg  Tabs (Atenolol) .Marland Kitchen.. 1 Tablet By Mouth At Bedtime 2)  Hydrochlorothiazide 12.5 Mg Caps (Hydrochlorothiazide) .Marland Kitchen.. 1 Daily By Mouth 3)  Fosamax 70 Mg Tabs (Alendronate Sodium) .... Take 1 Tablet By Mouth Once A Week 4)  Levoxyl 112 Mcg Tabs (Levothyroxine Sodium) .Marland Kitchen.. 1 Tablet By Mouth Once A Day 5)  Fish Oil Concentrate 1000 Mg  Caps (Omega-3 Fatty Acids) .Marland Kitchen.. 1 Daily By Mouth 6)  Multivitamins   Tabs (Multiple Vitamin) .Marland Kitchen.. 1 Daily By Mouth 7)  Bayer Aspirin Ec Low Dose 81 Mg  Tbec (Aspirin) .Marland Kitchen.. 1 Daily By Mouth 8)  Vitamin D 1000 Unit  Tabs (Cholecalciferol) .... One Tab By Mouth Two Times A Day 9)  Indocin 50 Mg .... Take 1 Three Times A Day With Food For 4days, Then Reduce To 1 Two Times A Day For  4-5days, Then As Directed As Needed 10)  Lisinopril-Hydrochlorothiazide 10-12.5 Mg Tabs (Lisinopril-Hydrochlorothiazide) .... One Tab By Mouth in Am.  Allergies: No Known Drug Allergies  Physical Exam  General:  alert, well-developed, well-nourished, and well-hydrated.   Head:  Normocephalic and atraumatic without obvious abnormalities. No apparent alopecia or balding. Eyes:  Conjunctiva clear bilaterally.  Ears:  External ear exam shows no significant lesions or deformities.  Otoscopic examination reveals clear canals, tympanic membranes are intact bilaterally without bulging, retraction, inflammation or discharge. Hearing is grossly normal bilaterally. Nose:  External nasal examination shows no deformity or inflammation. Nasal mucosa are pink and moist without lesions or exudates. Mouth:  Oral mucosa and oropharynx without lesions or exudates.  Teeth in good repair. Neck:  No deformities, masses, or tenderness noted. Lungs:  normal respiratory effort, no intercostal retractions, no accessory muscle use, and normal breath sounds.   Heart:  Normal rate and regular rhythm. S1 and S2 normal without gallop, murmur, click, rub or other extra sounds.   Impression & Recommendations:  Problem # 1:  HYPERTENSION (ICD-401.9) Assessment Improved Cont curr meds.  Her updated medication list for this problem includes:    Atenolol 25 Mg Tabs (Atenolol) .Marland Kitchen... 1 tablet by mouth at bedtime    Hydrochlorothiazide 12.5 Mg  Caps (Hydrochlorothiazide) .Marland Kitchen... 1 daily by mouth    Lisinopril-hydrochlorothiazide 10-12.5 Mg Tabs (Lisinopril-hydrochlorothiazide) ..... One tab by mouth in am.  BP today: 120/60 Prior BP: 150/80 (01/31/2009)  Labs Reviewed: K+: 3.9 (05/02/2009) Creat: : 0.7 (05/02/2009)   Chol: 210 (08/01/2008)   HDL: 41.70 (08/01/2008)   LDL: DEL (08/03/2007)   TG: 253.0 (08/01/2008)  Complete Medication List: 1)  Atenolol 25 Mg Tabs (Atenolol) .Marland Kitchen.. 1 tablet by mouth at bedtime 2)   Hydrochlorothiazide 12.5 Mg Caps (Hydrochlorothiazide) .Marland Kitchen.. 1 daily by mouth 3)  Fosamax 70 Mg Tabs (Alendronate sodium) .... Take 1 tablet by mouth once a week 4)  Levoxyl 112 Mcg Tabs (Levothyroxine sodium) .Marland Kitchen.. 1 tablet by mouth once a day 5)  Fish Oil Concentrate 1000 Mg Caps (Omega-3 fatty acids) .Marland Kitchen.. 1 daily by mouth 6)  Multivitamins Tabs (Multiple vitamin) .Marland Kitchen.. 1 daily by mouth 7)  Bayer Aspirin Ec Low Dose 81 Mg Tbec (Aspirin) .Marland Kitchen.. 1 daily by mouth 8)  Vitamin D 1000 Unit Tabs (Cholecalciferol) .... One tab by mouth two times a day 9)  Indocin 50 Mg  .... Take 1 three times a day with food for 4days, then reduce to 1 two times a day for 4-5days, then as directed as needed 10)  Lisinopril-hydrochlorothiazide 10-12.5 Mg Tabs (Lisinopril-hydrochlorothiazide) .... One tab by mouth in am.  Patient Instructions: 1)  RTC as sched in Jun.  Current Allergies (reviewed today): No known allergies

## 2010-03-26 NOTE — Letter (Signed)
Summary: Results Follow up Letter   at [facility]  [address] [place], [place] [address]   Phone: [phone]  Fax: [phone]    12/26/2009 MRN: [id number]  Jessica Lamb [address] [place], [place]  [address]  Dear Ms. Balliet,  The following are the results of your recent test(s):  Test         Result    Pap Smear:        Normal _____  Not Normal _____ Comments: ______________________________________________________ Cholesterol: LDL(Bad cholesterol):         Your goal is less than:         HDL (Good cholesterol):       Your goal is more than: Comments:  ______________________________________________________ Mammogram:        Normal __X___  Not Normal _____ Comments: Recheck in one year.  ___________________________________________________________________ Hemoccult:        Normal _____  Not normal _______ Comments:    _____________________________________________________________________ Other Tests:    We routinely do not discuss normal results over the telephone.  If you desire a copy of the results, or you have any questions about this information we can discuss them at your next office visit.   Sincerely,     Dr. Crawford Givens

## 2010-03-26 NOTE — Letter (Signed)
Summary: Results Follow up Letter  [facility] at [facility]  [address] [place], [place] [address]   Phone: [phone]  Fax: [phone]    10/16/2009 MRN: [id number]    MAEBELLE SULTON [address] [place], [place]  [address]    Dear Ms. Meadow,  The following are the results of your recent test(s):  Test         Result    Pap Smear:        Normal _____  Not Normal _____ Comments: ______________________________________________________ Cholesterol: LDL(Bad cholesterol):         Your goal is less than:         HDL (Good cholesterol):       Your goal is more than: Comments:  ______________________________________________________ Mammogram:        Normal _____  Not Normal _____ Comments:  ___________________________________________________________________ Hemoccult:        Normal __X___  Not normal _______ Comments:    _____________________________________________________________________ Other Tests:    We routinely do not discuss normal results over the telephone.  If you desire a copy of the results, or you have any questions about this information we can discuss them at your next office visit.   Sincerely,   Dwana Curd. Para March, M.D.  [facility]

## 2010-03-28 NOTE — Assessment & Plan Note (Signed)
Summary: F/U AFTER LABS / LFW   Vital Signs:  Patient profile:   65 year old female Height:      63 inches Weight:      153.75 pounds BMI:     27.33 Temp:     98.2 degrees F oral Pulse rate:   72 / minute Pulse rhythm:   regular BP sitting:   122 / 72  (left arm) Cuff size:   regular  Vitals Entered By: Delilah Shan CMA (AAMA) (March 14, 2010 8:30 AM) CC: 3 months follow up - labs   History of Present Illness: Feeling well. No c/o of any variety.  Here to talk about labs.    Prediabetes, A1c 6.1. labs reviewed.  exercising and losing weight.  working on diet.   Elevated Cholesterol: labs reviwed, not on meds. TG improved.  on 1 fish oil a day Other complaints:no  Hypertension:      Using medication without problems or lightheadedness: yes Chest pain with exertion:no Edema:no Short of breath:no Other issues: occ low BP with SBP in the 90s.  It is usually lower than today in the clinic.    Lost 20+ lbs with diet and exercise.    Allergies: No Known Drug Allergies  Past History:  Past Medical History: Last updated: 12/31/2009 Hyperlipidemia: 07/1999 Hypertension: 03/1999 Hypothyroidism: 1983 Osteoporosis on fosamax for 5 years as of 12/11, due for repeat scan in 12/2012 Gout  Social History: Last updated: 03/14/2010 Marital Status: Married since 1960 LIVES WITH HUSBAND Children: 3 OUT OF HOME From [place], [place] fan no tob  no alcohol Wii and walking for exercise  Social History: Marital Status: Married since 1960 LIVES WITH HUSBAND Children: 3 OUT OF HOME From [place], [place] fan no tob  no alcohol Wii and walking for exercise  Review of Systems       See HPI.  Otherwise negative.    Physical Exam  General:  GEN: nad, alert and oriented, decrease in weight noted on observation HEENT: mucous membranes moist NECK: supple w/o LA CV: rrr.  no murmur PULM: ctab, no inc wob ABD: soft, +bs EXT: no edema SKIN: no acute rash     Impression & Recommendations:  Problem # 1:  HYPERGLYCEMIA (ICD-790.29) Improved.  No change in meds. Continue to work on diet and exericse.  I thanked patient for her hard work.  "I feel great."  Problem # 2:  HYPERTENSION (ICD-401.9) Stop BB due to low reading and continue diet/exercise . The following medications were removed from the medication list:    Atenolol 25 Mg Tabs (Atenolol) .Marland Kitchen... 1 tablet by mouth at bedtime Her updated medication list for this problem includes:    Prinivil 10 Mg Tabs (Lisinopril) .Marland Kitchen... 1 by mouth every day    Hydrochlorothiazide 25 Mg Tabs (Hydrochlorothiazide) .Marland Kitchen... 1 by mouth every day  Problem # 3:  HYPERLIPIDEMIA (ICD-272.4) Improved.  Add on another fish oil and recheck later this year.  She agrees.   Complete Medication List: 1)  Levoxyl 112 Mcg Tabs (Levothyroxine sodium) .Marland Kitchen.. 1 tablet by mouth once a day 2)  Fish Oil Concentrate 1000 Mg Caps (Omega-3 fatty acids) .... 2 daily by mouth 3)  Multivitamins Tabs (Multiple vitamin) .Marland Kitchen.. 1 daily by mouth 4)  Bayer Aspirin Ec Low Dose 81 Mg Tbec (Aspirin) .Marland Kitchen.. 1 daily by mouth 5)  Vitamin D 1000 Unit Tabs (Cholecalciferol) .... One tab by mouth two times a day 6)  Prinivil 10 Mg Tabs (Lisinopril) .Marland KitchenMarland KitchenMarland Kitchen  1 by mouth every day 7)  Hydrochlorothiazide 25 Mg Tabs (Hydrochlorothiazide) .Marland Kitchen.. 1 by mouth every day 8)  Indomethacin 50 Mg Caps (Indomethacin) .... Take with food. 1 by mouth qid x1d, 1 by mouth three times a day x1d, then 1 two times a day x1d, then 1 by mouth once daily for gout flare.  Patient Instructions: 1)  Stop the atenolol and monitor your pressure over the next 2 weeks.  Let me know if it goes up high (>140/90) of if it stays low.   2)  Keep working on your diet and exercise- congrats on your weight loss.  I'm glad you've been working on this.   3)  I would recheck your fasting labs in 6 months (glucose/lipid/a1c- dx 272.4) with a OV a few days later.  Let me know if you have  concerns in the meantime.  4)  I would take 2 fish oil a day.    Orders Added: 1)  Est. Patient Level IV [[address]]    Current Allergies (reviewed today): No known allergies

## 2010-05-18 ENCOUNTER — Encounter: Payer: Self-pay | Admitting: Family Medicine

## 2010-09-02 ENCOUNTER — Other Ambulatory Visit: Payer: Self-pay | Admitting: Family Medicine

## 2010-09-02 DIAGNOSIS — E785 Hyperlipidemia, unspecified: Secondary | ICD-10-CM

## 2010-09-03 ENCOUNTER — Other Ambulatory Visit (INDEPENDENT_AMBULATORY_CARE_PROVIDER_SITE_OTHER): Payer: Medicare Other | Admitting: Family Medicine

## 2010-09-03 DIAGNOSIS — E119 Type 2 diabetes mellitus without complications: Secondary | ICD-10-CM

## 2010-09-03 DIAGNOSIS — E785 Hyperlipidemia, unspecified: Secondary | ICD-10-CM

## 2010-09-03 LAB — HEMOGLOBIN A1C: Hgb A1c MFr Bld: 6 % (ref 4.6–6.5)

## 2010-09-03 LAB — GLUCOSE, RANDOM: Glucose, Bld: 95 mg/dL (ref 70–99)

## 2010-09-03 LAB — LIPID PANEL
HDL: 57.5 mg/dL (ref >39.00)
VLDL: 43.2 mg/dL — ABNORMAL HIGH (ref 0.0–40.0)

## 2010-09-09 ENCOUNTER — Ambulatory Visit (INDEPENDENT_AMBULATORY_CARE_PROVIDER_SITE_OTHER): Payer: Medicare Other | Admitting: Family Medicine

## 2010-09-09 ENCOUNTER — Encounter: Payer: Self-pay | Admitting: Family Medicine

## 2010-09-09 DIAGNOSIS — Z299 Encounter for prophylactic measures, unspecified: Secondary | ICD-10-CM

## 2010-09-09 DIAGNOSIS — R7309 Other abnormal glucose: Secondary | ICD-10-CM

## 2010-09-09 DIAGNOSIS — M810 Age-related osteoporosis without current pathological fracture: Secondary | ICD-10-CM

## 2010-09-09 DIAGNOSIS — Z1211 Encounter for screening for malignant neoplasm of colon: Secondary | ICD-10-CM | POA: Insufficient documentation

## 2010-09-09 DIAGNOSIS — E785 Hyperlipidemia, unspecified: Secondary | ICD-10-CM

## 2010-09-09 DIAGNOSIS — E039 Hypothyroidism, unspecified: Secondary | ICD-10-CM

## 2010-09-09 DIAGNOSIS — I10 Essential (primary) hypertension: Secondary | ICD-10-CM

## 2010-09-09 MED ORDER — HYDROCHLOROTHIAZIDE 25 MG PO TABS: 12.5000 mg | ORAL_TABLET | Freq: Every day | ORAL | Status: DC

## 2010-09-09 NOTE — Progress Notes (Signed)
"  I feel good."  She has lost 20+ lbs and kept it off.  Hypertension:    Using medication without problems or lightheadedness: yes Chest pain with exertion:no Edema:no Short of breath:no Average home BPs: occ with low BP readings, ie 90s/50s, a/w some fatigue.   No FH of early CAD, but some in advanced age.    Lipids slightly elevated, d/w pt today.  She would like to continue to work on diet/weight.   Flu,shingles, PNA vaccines encouraged.    No FH colon cancer. She would like IFOB.    Meds, vitals, and allergies reviewed.   PMH and SH reviewed  ROS: See HPI.  Otherwise negative.    GEN: nad, alert and oriented HEENT: mucous membranes moist NECK: supple w/o LA CV: rrr. PULM: ctab, no inc wob ABD: soft, +bs EXT: no edema SKIN: no acute rash

## 2010-09-09 NOTE — Assessment & Plan Note (Signed)
Cut HCTZ, then stop ACE if low BP persists.  She agrees.

## 2010-09-09 NOTE — Patient Instructions (Signed)
Keep exercising and working on your weight. Cut the HCTZ in half, take 12.5mg  a day.  If you continue to have lower BP readings, stop the lisinopril too. Take care.  I would think about getting the shingles, pneumonia and flu vaccines. 64month follow up, visit.  Labs ahead of time.   Glad to see you.

## 2010-09-09 NOTE — Assessment & Plan Note (Signed)
Now sugar below 100.  Will stop A1c checks.  Continue fasting glucose checks periodically.  Continue with exercise and diet.

## 2010-09-09 NOTE — Assessment & Plan Note (Signed)
Continue diet and exercise. 

## 2010-09-09 NOTE — Assessment & Plan Note (Signed)
D/w patient re:options for colon cancer screening, including IFOB vs. colonoscopy.  Risks and benefits of both were discussed and patient voiced understanding.  Pt elects for:IFOB  

## 2010-09-13 ENCOUNTER — Other Ambulatory Visit: Payer: Self-pay | Admitting: Family Medicine

## 2010-09-13 ENCOUNTER — Other Ambulatory Visit: Payer: Medicare Other

## 2010-09-13 DIAGNOSIS — Z1289 Encounter for screening for malignant neoplasm of other sites: Secondary | ICD-10-CM

## 2010-09-13 LAB — FECAL OCCULT BLOOD, IMMUNOCHEMICAL: Fecal Occult Bld: NEGATIVE

## 2010-09-17 ENCOUNTER — Encounter: Payer: Self-pay | Admitting: *Deleted

## 2010-10-20 ENCOUNTER — Other Ambulatory Visit: Payer: Self-pay | Admitting: Family Medicine

## 2010-12-19 ENCOUNTER — Other Ambulatory Visit: Payer: Self-pay | Admitting: Family Medicine

## 2011-03-10 ENCOUNTER — Other Ambulatory Visit: Payer: Medicare Other

## 2011-03-13 ENCOUNTER — Ambulatory Visit: Payer: Medicare Other | Admitting: Family Medicine

## 2019-03-22 DIAGNOSIS — I10 Essential (primary) hypertension: Secondary | ICD-10-CM | POA: Diagnosis not present

## 2019-03-22 DIAGNOSIS — L600 Ingrowing nail: Secondary | ICD-10-CM | POA: Diagnosis not present

## 2019-03-22 DIAGNOSIS — M810 Age-related osteoporosis without current pathological fracture: Secondary | ICD-10-CM | POA: Diagnosis not present

## 2019-03-22 DIAGNOSIS — Z6829 Body mass index (BMI) 29.0-29.9, adult: Secondary | ICD-10-CM | POA: Diagnosis not present

## 2019-03-22 DIAGNOSIS — E119 Type 2 diabetes mellitus without complications: Secondary | ICD-10-CM | POA: Diagnosis not present

## 2019-03-22 DIAGNOSIS — E7849 Other hyperlipidemia: Secondary | ICD-10-CM | POA: Diagnosis not present

## 2019-03-22 DIAGNOSIS — E038 Other specified hypothyroidism: Secondary | ICD-10-CM | POA: Diagnosis not present

## 2019-04-19 DIAGNOSIS — E11319 Type 2 diabetes mellitus with unspecified diabetic retinopathy without macular edema: Secondary | ICD-10-CM | POA: Diagnosis not present

## 2019-07-20 DIAGNOSIS — I10 Essential (primary) hypertension: Secondary | ICD-10-CM | POA: Diagnosis not present

## 2019-07-20 DIAGNOSIS — E038 Other specified hypothyroidism: Secondary | ICD-10-CM | POA: Diagnosis not present

## 2019-07-20 DIAGNOSIS — Z6829 Body mass index (BMI) 29.0-29.9, adult: Secondary | ICD-10-CM | POA: Diagnosis not present

## 2019-07-20 DIAGNOSIS — Z1331 Encounter for screening for depression: Secondary | ICD-10-CM | POA: Diagnosis not present

## 2019-07-20 DIAGNOSIS — M810 Age-related osteoporosis without current pathological fracture: Secondary | ICD-10-CM | POA: Diagnosis not present

## 2019-07-20 DIAGNOSIS — E119 Type 2 diabetes mellitus without complications: Secondary | ICD-10-CM | POA: Diagnosis not present

## 2019-07-20 DIAGNOSIS — E7849 Other hyperlipidemia: Secondary | ICD-10-CM | POA: Diagnosis not present

## 2019-07-20 DIAGNOSIS — Z0000 Encounter for general adult medical examination without abnormal findings: Secondary | ICD-10-CM | POA: Diagnosis not present

## 2019-09-06 DIAGNOSIS — Z6829 Body mass index (BMI) 29.0-29.9, adult: Secondary | ICD-10-CM | POA: Diagnosis not present

## 2019-09-06 DIAGNOSIS — M25562 Pain in left knee: Secondary | ICD-10-CM | POA: Diagnosis not present

## 2019-09-07 DIAGNOSIS — M25462 Effusion, left knee: Secondary | ICD-10-CM | POA: Diagnosis not present

## 2019-09-22 ENCOUNTER — Ambulatory Visit (INDEPENDENT_AMBULATORY_CARE_PROVIDER_SITE_OTHER): Payer: Medicare PPO | Admitting: Orthopaedic Surgery

## 2019-09-22 ENCOUNTER — Other Ambulatory Visit: Payer: Self-pay

## 2019-09-22 ENCOUNTER — Encounter: Payer: Self-pay | Admitting: Orthopaedic Surgery

## 2019-09-22 DIAGNOSIS — M659 Synovitis and tenosynovitis, unspecified: Secondary | ICD-10-CM | POA: Diagnosis not present

## 2019-09-22 DIAGNOSIS — M25562 Pain in left knee: Secondary | ICD-10-CM | POA: Diagnosis not present

## 2019-09-22 MED ORDER — LIDOCAINE HCL 1 % IJ SOLN
0.5000 mL | INTRAMUSCULAR | Status: AC | PRN
  Administered 2019-09-22: .5 mL

## 2019-09-22 MED ORDER — BUPIVACAINE HCL 0.5 % IJ SOLN
3.0000 mL | INTRAMUSCULAR | Status: AC | PRN
  Administered 2019-09-22: 3 mL via INTRA_ARTICULAR

## 2019-09-22 MED ORDER — METHYLPREDNISOLONE ACETATE 40 MG/ML IJ SUSP
40.0000 mg | INTRAMUSCULAR | Status: AC | PRN
  Administered 2019-09-22: 40 mg via INTRA_ARTICULAR

## 2019-09-22 NOTE — Progress Notes (Signed)
Office Visit Note   Patient: Jessica Lamb           Date of Birth: 26-Feb-1945           MRN: [id number] Visit Date: 09/22/2019              Requested by: Joaquim Nam, MD [address] [place],  [place] [address] PCP: Joaquim Nam, MD   Assessment & Plan: Visit Diagnoses:  1. Synovitis of left knee     Plan: Intra-articular injection performed which she tolerated well.  If she has ongoing symptoms she will call and we will proceed with MRI scan to rule out posterior medial meniscal tear.  She is not having any true locking at this point.  She might have had some subclinical gout with synovitis in her knee for the last month.  Hopefully the injection will take care of her problem she will call if she is having continued problems.  Follow-Up Instructions: No follow-ups on file.   Orders:  No orders of the defined types were placed in this encounter.  No orders of the defined types were placed in this encounter.     Procedures: Large Joint Inj: L knee on 09/22/2019 11:37 AM Indications: joint swelling and pain Details: 22 G 1.5 in needle, anterolateral approach  Arthrogram: No  Medications: 0.5 mL lidocaine 1 %; 3 mL bupivacaine 0.5 %; 40 mg methylPREDNISolone acetate 40 MG/ML Outcome: tolerated well, no immediate complications Procedure, treatment alternatives, risks and benefits explained, specific risks discussed. Consent was given by the patient. Immediately prior to procedure a time out was called to verify the correct patient, procedure, equipment, support staff and site/side marked as required. Patient was prepped and draped in the usual sterile fashion.       Clinical Data: No additional findings.   Subjective: Chief Complaint  Patient presents with  . Left Knee - Pain    HPI 74 year old female followed by Dr.Hasanaj for problems with left knee pain swelling and medial and posterior medial pain.  She states she does well when she walks but  when she goes from sitting standing she has significant problems.  Past history of gout she thinks it was her great toe on the right foot probably 10 years ago she has been on allopurinol since that time.  She does not recall any problems with erythema or gout attacks in other joints.  She had taken ibuprofen 800 mg which helped some.  Patient does have diabetes is on Metformin.  Also has problems with hypertension thyroid condition.  Review of Systems all other systems are negative as pertains HPI.   Objective: Vital Signs: Ht 5\' 3"  (1.6 m)   Wt 168 lb (76.2 kg)   BMI 29.76 kg/m   Physical Exam Constitutional:      Appearance: She is well-developed.  HENT:     Head: Normocephalic.     Right Ear: External ear normal.     Left Ear: External ear normal.  Eyes:     Pupils: Pupils are equal, round, and reactive to light.  Neck:     Thyroid: No thyromegaly.     Trachea: No tracheal deviation.  Cardiovascular:     Rate and Rhythm: Normal rate.  Pulmonary:     Effort: Pulmonary effort is normal.  Abdominal:     Palpations: Abdomen is soft.  Skin:    General: Skin is warm and dry.  Neurological:     Mental Status: She  is alert and oriented to person, place, and time.  Psychiatric:        Behavior: Behavior normal.     Ortho Exam negative logroll the hips with 2+ knee effusion on the left knee more tenderness along the medial joint line posterior medial.  Only trace crepitus with knee extension she is slow with reaching full extension.  Negative Lachman test negative anterior drawer.  Normal distal pulses opposite right knee shows no swelling.  Specialty Comments:  No specialty comments available.  Imaging: No results found.   PMFS History: Patient Active Problem List   Diagnosis Date Noted  . Synovitis of left knee 09/22/2019  . Colon cancer screening 09/09/2010  . Preventive measure 09/09/2010  . Osteoporosis   . GOUTY ARTHROPATHY UNSPECIFIED 12/04/2009  . SHOULDER  PAIN, RIGHT 08/07/2008  . EXTERNAL HEMORRHOIDS 03/16/2008  . HYPOTHYROIDISM 12/22/2006  . HYPERLIPIDEMIA 12/22/2006  . HYPERTENSION 12/22/2006  . HYPERGLYCEMIA 12/22/2006   Past Medical History:  Diagnosis Date  . Gout   . Hyperlipidemia 07/1999  . Hypertension 03/1999  . Osteoporosis    On Fosamax for five years as of 01/2010// due for repeat scan in 12/2012  . Thyroid disease 1983   hypothyroidism    Family History  Problem Relation Age of Onset  . Heart disease Mother 2       natural causes  . Diabetes Mother   . Heart disease Father 9       CHF  . Cancer Sister 71       Lung cancer  . Heart disease Brother 80       pacer  . Alcohol abuse Neg Hx   . Depression Neg Hx   . Cancer Sister 14       ovarian cancer  . Alzheimer's disease Sister 39  . Cancer Sister 50       cancer of kidney and bladder// nephrectomy  . Diabetes Sister   . Stroke Sister   . Alzheimer's disease Sister      Social History   Occupational History  . Not on file  Tobacco Use  . Smoking status: Never Smoker  . Smokeless tobacco: Never Used  Substance and Sexual Activity  . Alcohol use: No  . Drug use: No  . Sexual activity: Not on file

## 2019-10-03 DIAGNOSIS — M79672 Pain in left foot: Secondary | ICD-10-CM | POA: Diagnosis not present

## 2019-10-03 DIAGNOSIS — M7989 Other specified soft tissue disorders: Secondary | ICD-10-CM | POA: Diagnosis not present

## 2019-10-03 DIAGNOSIS — R600 Localized edema: Secondary | ICD-10-CM | POA: Diagnosis not present

## 2019-10-03 DIAGNOSIS — M79605 Pain in left leg: Secondary | ICD-10-CM | POA: Diagnosis not present

## 2019-11-23 DIAGNOSIS — E038 Other specified hypothyroidism: Secondary | ICD-10-CM | POA: Diagnosis not present

## 2019-11-23 DIAGNOSIS — E1169 Type 2 diabetes mellitus with other specified complication: Secondary | ICD-10-CM | POA: Diagnosis not present

## 2019-11-23 DIAGNOSIS — M25562 Pain in left knee: Secondary | ICD-10-CM | POA: Diagnosis not present

## 2019-11-23 DIAGNOSIS — Z6829 Body mass index (BMI) 29.0-29.9, adult: Secondary | ICD-10-CM | POA: Diagnosis not present

## 2019-11-23 DIAGNOSIS — Z1331 Encounter for screening for depression: Secondary | ICD-10-CM | POA: Diagnosis not present

## 2019-11-23 DIAGNOSIS — Z0000 Encounter for general adult medical examination without abnormal findings: Secondary | ICD-10-CM | POA: Diagnosis not present

## 2019-11-23 DIAGNOSIS — I10 Essential (primary) hypertension: Secondary | ICD-10-CM | POA: Diagnosis not present

## 2019-12-01 DIAGNOSIS — Z23 Encounter for immunization: Secondary | ICD-10-CM | POA: Diagnosis not present

## 2019-12-06 DIAGNOSIS — M810 Age-related osteoporosis without current pathological fracture: Secondary | ICD-10-CM | POA: Diagnosis not present

## 2019-12-06 DIAGNOSIS — M85852 Other specified disorders of bone density and structure, left thigh: Secondary | ICD-10-CM | POA: Diagnosis not present

## 2020-01-03 DIAGNOSIS — M810 Age-related osteoporosis without current pathological fracture: Secondary | ICD-10-CM | POA: Diagnosis not present

## 2020-03-19 DIAGNOSIS — M25562 Pain in left knee: Secondary | ICD-10-CM | POA: Diagnosis not present

## 2020-03-19 DIAGNOSIS — Z6829 Body mass index (BMI) 29.0-29.9, adult: Secondary | ICD-10-CM | POA: Diagnosis not present

## 2020-03-19 DIAGNOSIS — E1169 Type 2 diabetes mellitus with other specified complication: Secondary | ICD-10-CM | POA: Diagnosis not present

## 2020-03-19 DIAGNOSIS — I10 Essential (primary) hypertension: Secondary | ICD-10-CM | POA: Diagnosis not present

## 2020-03-19 DIAGNOSIS — E038 Other specified hypothyroidism: Secondary | ICD-10-CM | POA: Diagnosis not present

## 2020-07-30 DIAGNOSIS — M25562 Pain in left knee: Secondary | ICD-10-CM | POA: Diagnosis not present

## 2020-07-30 DIAGNOSIS — Z6829 Body mass index (BMI) 29.0-29.9, adult: Secondary | ICD-10-CM | POA: Diagnosis not present

## 2020-07-30 DIAGNOSIS — I10 Essential (primary) hypertension: Secondary | ICD-10-CM | POA: Diagnosis not present

## 2020-07-30 DIAGNOSIS — E038 Other specified hypothyroidism: Secondary | ICD-10-CM | POA: Diagnosis not present

## 2020-07-30 DIAGNOSIS — E1169 Type 2 diabetes mellitus with other specified complication: Secondary | ICD-10-CM | POA: Diagnosis not present

## 2020-11-29 DIAGNOSIS — E1169 Type 2 diabetes mellitus with other specified complication: Secondary | ICD-10-CM | POA: Diagnosis not present

## 2020-11-29 DIAGNOSIS — I10 Essential (primary) hypertension: Secondary | ICD-10-CM | POA: Diagnosis not present

## 2020-11-29 DIAGNOSIS — Z0000 Encounter for general adult medical examination without abnormal findings: Secondary | ICD-10-CM | POA: Diagnosis not present

## 2020-11-29 DIAGNOSIS — Z6830 Body mass index (BMI) 30.0-30.9, adult: Secondary | ICD-10-CM | POA: Diagnosis not present

## 2020-11-29 DIAGNOSIS — M25562 Pain in left knee: Secondary | ICD-10-CM | POA: Diagnosis not present

## 2020-11-29 DIAGNOSIS — E038 Other specified hypothyroidism: Secondary | ICD-10-CM | POA: Diagnosis not present

## 2020-12-20 DIAGNOSIS — Z23 Encounter for immunization: Secondary | ICD-10-CM | POA: Diagnosis not present

## 2021-04-04 DIAGNOSIS — E1169 Type 2 diabetes mellitus with other specified complication: Secondary | ICD-10-CM | POA: Diagnosis not present

## 2021-04-04 DIAGNOSIS — M25562 Pain in left knee: Secondary | ICD-10-CM | POA: Diagnosis not present

## 2021-04-04 DIAGNOSIS — Z6829 Body mass index (BMI) 29.0-29.9, adult: Secondary | ICD-10-CM | POA: Diagnosis not present

## 2021-04-04 DIAGNOSIS — I10 Essential (primary) hypertension: Secondary | ICD-10-CM | POA: Diagnosis not present

## 2021-04-04 DIAGNOSIS — E038 Other specified hypothyroidism: Secondary | ICD-10-CM | POA: Diagnosis not present

## 2021-04-04 DIAGNOSIS — Z0000 Encounter for general adult medical examination without abnormal findings: Secondary | ICD-10-CM | POA: Diagnosis not present

## 2021-05-06 DIAGNOSIS — E119 Type 2 diabetes mellitus without complications: Secondary | ICD-10-CM | POA: Diagnosis not present

## 2021-07-31 DIAGNOSIS — Z6828 Body mass index (BMI) 28.0-28.9, adult: Secondary | ICD-10-CM | POA: Diagnosis not present

## 2021-07-31 DIAGNOSIS — M810 Age-related osteoporosis without current pathological fracture: Secondary | ICD-10-CM | POA: Diagnosis not present

## 2021-07-31 DIAGNOSIS — E038 Other specified hypothyroidism: Secondary | ICD-10-CM | POA: Diagnosis not present

## 2021-07-31 DIAGNOSIS — I10 Essential (primary) hypertension: Secondary | ICD-10-CM | POA: Diagnosis not present

## 2021-07-31 DIAGNOSIS — E1169 Type 2 diabetes mellitus with other specified complication: Secondary | ICD-10-CM | POA: Diagnosis not present

## 2021-07-31 DIAGNOSIS — E7849 Other hyperlipidemia: Secondary | ICD-10-CM | POA: Diagnosis not present

## 2021-11-27 DIAGNOSIS — M810 Age-related osteoporosis without current pathological fracture: Secondary | ICD-10-CM | POA: Diagnosis not present

## 2021-11-27 DIAGNOSIS — E038 Other specified hypothyroidism: Secondary | ICD-10-CM | POA: Diagnosis not present

## 2021-11-27 DIAGNOSIS — I10 Essential (primary) hypertension: Secondary | ICD-10-CM | POA: Diagnosis not present

## 2021-11-27 DIAGNOSIS — E7849 Other hyperlipidemia: Secondary | ICD-10-CM | POA: Diagnosis not present

## 2021-11-27 DIAGNOSIS — E1169 Type 2 diabetes mellitus with other specified complication: Secondary | ICD-10-CM | POA: Diagnosis not present

## 2021-11-27 DIAGNOSIS — Z6829 Body mass index (BMI) 29.0-29.9, adult: Secondary | ICD-10-CM | POA: Diagnosis not present

## 2021-11-27 DIAGNOSIS — Z0000 Encounter for general adult medical examination without abnormal findings: Secondary | ICD-10-CM | POA: Diagnosis not present

## 2022-04-02 DIAGNOSIS — M810 Age-related osteoporosis without current pathological fracture: Secondary | ICD-10-CM | POA: Diagnosis not present

## 2022-04-02 DIAGNOSIS — E7849 Other hyperlipidemia: Secondary | ICD-10-CM | POA: Diagnosis not present

## 2022-04-02 DIAGNOSIS — E1169 Type 2 diabetes mellitus with other specified complication: Secondary | ICD-10-CM | POA: Diagnosis not present

## 2022-04-02 DIAGNOSIS — Z0000 Encounter for general adult medical examination without abnormal findings: Secondary | ICD-10-CM | POA: Diagnosis not present

## 2022-04-02 DIAGNOSIS — E038 Other specified hypothyroidism: Secondary | ICD-10-CM | POA: Diagnosis not present

## 2022-04-02 DIAGNOSIS — I10 Essential (primary) hypertension: Secondary | ICD-10-CM | POA: Diagnosis not present

## 2022-04-02 DIAGNOSIS — Z6828 Body mass index (BMI) 28.0-28.9, adult: Secondary | ICD-10-CM | POA: Diagnosis not present

## 2022-04-07 DIAGNOSIS — Z1231 Encounter for screening mammogram for malignant neoplasm of breast: Secondary | ICD-10-CM | POA: Diagnosis not present

## 2022-06-24 DIAGNOSIS — M810 Age-related osteoporosis without current pathological fracture: Secondary | ICD-10-CM | POA: Diagnosis not present

## 2022-06-26 DIAGNOSIS — H43811 Vitreous degeneration, right eye: Secondary | ICD-10-CM | POA: Diagnosis not present

## 2022-07-09 DIAGNOSIS — E7849 Other hyperlipidemia: Secondary | ICD-10-CM | POA: Diagnosis not present

## 2022-07-09 DIAGNOSIS — M810 Age-related osteoporosis without current pathological fracture: Secondary | ICD-10-CM | POA: Diagnosis not present

## 2022-07-09 DIAGNOSIS — E1169 Type 2 diabetes mellitus with other specified complication: Secondary | ICD-10-CM | POA: Diagnosis not present

## 2022-07-09 DIAGNOSIS — Z0000 Encounter for general adult medical examination without abnormal findings: Secondary | ICD-10-CM | POA: Diagnosis not present

## 2022-07-09 DIAGNOSIS — I10 Essential (primary) hypertension: Secondary | ICD-10-CM | POA: Diagnosis not present

## 2022-07-09 DIAGNOSIS — E038 Other specified hypothyroidism: Secondary | ICD-10-CM | POA: Diagnosis not present

## 2022-07-15 DIAGNOSIS — M810 Age-related osteoporosis without current pathological fracture: Secondary | ICD-10-CM | POA: Diagnosis not present

## 2022-07-31 DIAGNOSIS — Z6828 Body mass index (BMI) 28.0-28.9, adult: Secondary | ICD-10-CM | POA: Diagnosis not present

## 2022-07-31 DIAGNOSIS — M810 Age-related osteoporosis without current pathological fracture: Secondary | ICD-10-CM | POA: Diagnosis not present

## 2022-07-31 DIAGNOSIS — E1121 Type 2 diabetes mellitus with diabetic nephropathy: Secondary | ICD-10-CM | POA: Diagnosis not present

## 2022-07-31 DIAGNOSIS — E7849 Other hyperlipidemia: Secondary | ICD-10-CM | POA: Diagnosis not present

## 2022-07-31 DIAGNOSIS — Z0000 Encounter for general adult medical examination without abnormal findings: Secondary | ICD-10-CM | POA: Diagnosis not present

## 2022-07-31 DIAGNOSIS — E038 Other specified hypothyroidism: Secondary | ICD-10-CM | POA: Diagnosis not present

## 2022-07-31 DIAGNOSIS — I10 Essential (primary) hypertension: Secondary | ICD-10-CM | POA: Diagnosis not present

## 2022-07-31 DIAGNOSIS — N182 Chronic kidney disease, stage 2 (mild): Secondary | ICD-10-CM | POA: Diagnosis not present

## 2022-11-27 DIAGNOSIS — E1121 Type 2 diabetes mellitus with diabetic nephropathy: Secondary | ICD-10-CM | POA: Diagnosis not present

## 2022-11-27 DIAGNOSIS — I10 Essential (primary) hypertension: Secondary | ICD-10-CM | POA: Diagnosis not present

## 2022-11-27 DIAGNOSIS — H6122 Impacted cerumen, left ear: Secondary | ICD-10-CM | POA: Diagnosis not present

## 2022-11-27 DIAGNOSIS — N182 Chronic kidney disease, stage 2 (mild): Secondary | ICD-10-CM | POA: Diagnosis not present

## 2022-11-27 DIAGNOSIS — E7849 Other hyperlipidemia: Secondary | ICD-10-CM | POA: Diagnosis not present

## 2022-11-27 DIAGNOSIS — Z6828 Body mass index (BMI) 28.0-28.9, adult: Secondary | ICD-10-CM | POA: Diagnosis not present

## 2022-11-27 DIAGNOSIS — M7051 Other bursitis of knee, right knee: Secondary | ICD-10-CM | POA: Diagnosis not present

## 2022-11-27 DIAGNOSIS — E038 Other specified hypothyroidism: Secondary | ICD-10-CM | POA: Diagnosis not present

## 2022-11-27 DIAGNOSIS — M810 Age-related osteoporosis without current pathological fracture: Secondary | ICD-10-CM | POA: Diagnosis not present

## 2022-12-09 DIAGNOSIS — R809 Proteinuria, unspecified: Secondary | ICD-10-CM | POA: Diagnosis not present

## 2023-04-02 DIAGNOSIS — I10 Essential (primary) hypertension: Secondary | ICD-10-CM | POA: Diagnosis not present

## 2023-04-02 DIAGNOSIS — M7051 Other bursitis of knee, right knee: Secondary | ICD-10-CM | POA: Diagnosis not present

## 2023-04-02 DIAGNOSIS — E038 Other specified hypothyroidism: Secondary | ICD-10-CM | POA: Diagnosis not present

## 2023-04-02 DIAGNOSIS — E1122 Type 2 diabetes mellitus with diabetic chronic kidney disease: Secondary | ICD-10-CM | POA: Diagnosis not present

## 2023-04-02 DIAGNOSIS — E7849 Other hyperlipidemia: Secondary | ICD-10-CM | POA: Diagnosis not present

## 2023-04-02 DIAGNOSIS — Z0000 Encounter for general adult medical examination without abnormal findings: Secondary | ICD-10-CM | POA: Diagnosis not present

## 2023-04-02 DIAGNOSIS — M810 Age-related osteoporosis without current pathological fracture: Secondary | ICD-10-CM | POA: Diagnosis not present

## 2023-04-02 DIAGNOSIS — Z6826 Body mass index (BMI) 26.0-26.9, adult: Secondary | ICD-10-CM | POA: Diagnosis not present

## 2023-04-02 DIAGNOSIS — N182 Chronic kidney disease, stage 2 (mild): Secondary | ICD-10-CM | POA: Diagnosis not present

## 2023-04-02 DIAGNOSIS — E1121 Type 2 diabetes mellitus with diabetic nephropathy: Secondary | ICD-10-CM | POA: Diagnosis not present

## 2023-04-02 DIAGNOSIS — M1731 Unilateral post-traumatic osteoarthritis, right knee: Secondary | ICD-10-CM | POA: Diagnosis not present

## 2023-04-13 DIAGNOSIS — E213 Hyperparathyroidism, unspecified: Secondary | ICD-10-CM | POA: Diagnosis not present

## 2023-06-02 DIAGNOSIS — E1121 Type 2 diabetes mellitus with diabetic nephropathy: Secondary | ICD-10-CM | POA: Diagnosis not present

## 2023-06-29 ENCOUNTER — Ambulatory Visit

## 2023-06-29 ENCOUNTER — Other Ambulatory Visit: Payer: Self-pay | Admitting: Orthopedic Surgery

## 2023-06-29 DIAGNOSIS — R2242 Localized swelling, mass and lump, left lower limb: Secondary | ICD-10-CM

## 2023-06-30 DIAGNOSIS — E113292 Type 2 diabetes mellitus with mild nonproliferative diabetic retinopathy without macular edema, left eye: Secondary | ICD-10-CM | POA: Diagnosis not present

## 2023-07-28 DIAGNOSIS — M1731 Unilateral post-traumatic osteoarthritis, right knee: Secondary | ICD-10-CM | POA: Diagnosis not present

## 2023-07-28 DIAGNOSIS — Z0000 Encounter for general adult medical examination without abnormal findings: Secondary | ICD-10-CM | POA: Diagnosis not present

## 2023-07-28 DIAGNOSIS — N182 Chronic kidney disease, stage 2 (mild): Secondary | ICD-10-CM | POA: Diagnosis not present

## 2023-07-28 DIAGNOSIS — I10 Essential (primary) hypertension: Secondary | ICD-10-CM | POA: Diagnosis not present

## 2023-07-28 DIAGNOSIS — E038 Other specified hypothyroidism: Secondary | ICD-10-CM | POA: Diagnosis not present

## 2023-07-28 DIAGNOSIS — E1122 Type 2 diabetes mellitus with diabetic chronic kidney disease: Secondary | ICD-10-CM | POA: Diagnosis not present

## 2023-07-28 DIAGNOSIS — E7849 Other hyperlipidemia: Secondary | ICD-10-CM | POA: Diagnosis not present

## 2023-07-28 DIAGNOSIS — M810 Age-related osteoporosis without current pathological fracture: Secondary | ICD-10-CM | POA: Diagnosis not present

## 2023-07-28 DIAGNOSIS — Z6826 Body mass index (BMI) 26.0-26.9, adult: Secondary | ICD-10-CM | POA: Diagnosis not present

## 2023-07-30 DIAGNOSIS — H35372 Puckering of macula, left eye: Secondary | ICD-10-CM | POA: Diagnosis not present

## 2023-07-30 DIAGNOSIS — E113292 Type 2 diabetes mellitus with mild nonproliferative diabetic retinopathy without macular edema, left eye: Secondary | ICD-10-CM | POA: Diagnosis not present

## 2023-07-30 DIAGNOSIS — H04123 Dry eye syndrome of bilateral lacrimal glands: Secondary | ICD-10-CM | POA: Diagnosis not present

## 2023-09-21 DIAGNOSIS — H04123 Dry eye syndrome of bilateral lacrimal glands: Secondary | ICD-10-CM | POA: Diagnosis not present

## 2023-11-12 DIAGNOSIS — H25813 Combined forms of age-related cataract, bilateral: Secondary | ICD-10-CM | POA: Diagnosis not present

## 2023-11-12 DIAGNOSIS — H04123 Dry eye syndrome of bilateral lacrimal glands: Secondary | ICD-10-CM | POA: Diagnosis not present

## 2023-12-01 DIAGNOSIS — E7849 Other hyperlipidemia: Secondary | ICD-10-CM | POA: Diagnosis not present

## 2023-12-01 DIAGNOSIS — Z0000 Encounter for general adult medical examination without abnormal findings: Secondary | ICD-10-CM | POA: Diagnosis not present

## 2023-12-01 DIAGNOSIS — N182 Chronic kidney disease, stage 2 (mild): Secondary | ICD-10-CM | POA: Diagnosis not present

## 2023-12-01 DIAGNOSIS — E1122 Type 2 diabetes mellitus with diabetic chronic kidney disease: Secondary | ICD-10-CM | POA: Diagnosis not present

## 2023-12-01 DIAGNOSIS — E038 Other specified hypothyroidism: Secondary | ICD-10-CM | POA: Diagnosis not present

## 2023-12-01 DIAGNOSIS — M1731 Unilateral post-traumatic osteoarthritis, right knee: Secondary | ICD-10-CM | POA: Diagnosis not present

## 2023-12-01 DIAGNOSIS — I10 Essential (primary) hypertension: Secondary | ICD-10-CM | POA: Diagnosis not present

## 2023-12-01 DIAGNOSIS — Z6827 Body mass index (BMI) 27.0-27.9, adult: Secondary | ICD-10-CM | POA: Diagnosis not present

## 2023-12-01 DIAGNOSIS — M810 Age-related osteoporosis without current pathological fracture: Secondary | ICD-10-CM | POA: Diagnosis not present
